# Patient Record
Sex: Male | Born: 1955 | Race: White | Hispanic: No | Marital: Married
Health system: Southern US, Community
[De-identification: ages and names within clinical notes are randomized; demographics above are authoritative.]

---

## 2006-06-02 ENCOUNTER — Emergency Department (HOSPITAL_COMMUNITY): Admission: EM | Admit: 2006-06-02 | Discharge: 2006-06-02 | Payer: Self-pay | Admitting: Emergency Medicine

## 2008-09-10 ENCOUNTER — Observation Stay (HOSPITAL_COMMUNITY): Admission: EM | Admit: 2008-09-10 | Discharge: 2008-09-11 | Payer: Self-pay | Admitting: Emergency Medicine

## 2008-09-10 ENCOUNTER — Ambulatory Visit: Payer: Self-pay | Admitting: Cardiology

## 2008-09-11 ENCOUNTER — Encounter (INDEPENDENT_AMBULATORY_CARE_PROVIDER_SITE_OTHER): Payer: Self-pay | Admitting: Emergency Medicine

## 2008-09-18 DIAGNOSIS — R55 Syncope and collapse: Secondary | ICD-10-CM | POA: Insufficient documentation

## 2008-09-18 DIAGNOSIS — R079 Chest pain, unspecified: Secondary | ICD-10-CM | POA: Insufficient documentation

## 2008-09-19 ENCOUNTER — Ambulatory Visit: Payer: Self-pay | Admitting: Cardiovascular Disease

## 2008-09-19 DIAGNOSIS — R0602 Shortness of breath: Secondary | ICD-10-CM | POA: Insufficient documentation

## 2008-12-21 IMAGING — CT CT CERVICAL SPINE W/O CM
4 of 5 series · 16 of 33 positions shown, 18 images · non-contrast
Comparison: None.
COMPARISON: None.

CLINICAL DATA: Syncope. Fall. Headache. Neck pain.

CRANIAL CT WITHOUT CONTRAST  06/02/2006:
TECHNIQUE: 5mm axial images were obtained from the skull base through the
vertex without intravenous contrast.
TECHNIQUE: Multidetector CT imaging of the cervical spine was performed without
contrast.  Sagittal and coronal plane reformatted images were reconstructed from
the axial CT data, and were also reviewed.

[Series 6: c_spine 2.0 b31s detail · axial · 0.24mm/px · z∈[-252,-156]mm · 4 of 80 slices shown, 5 images]
[im 16/80  soft-tissue]
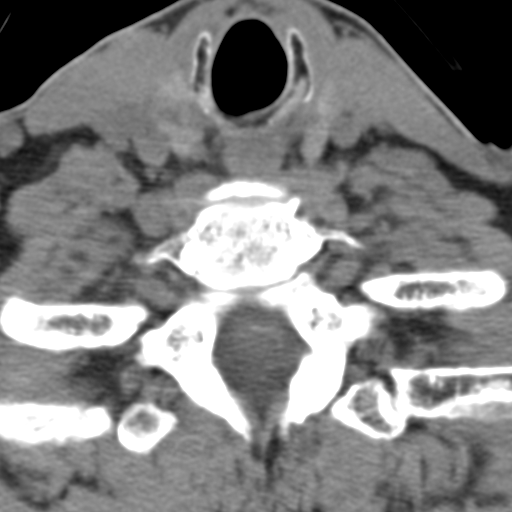
[im 16/80  bone]
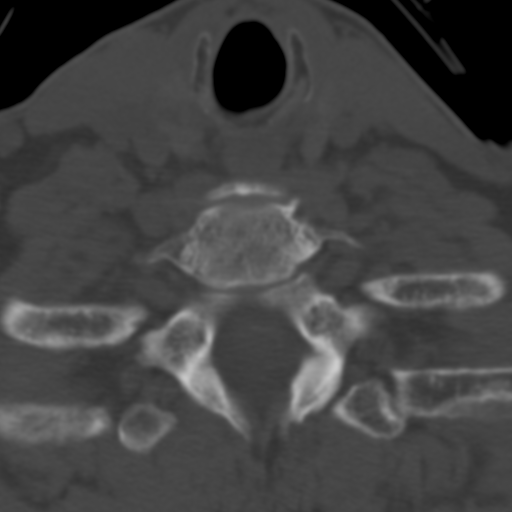
[im 32/80  bone]
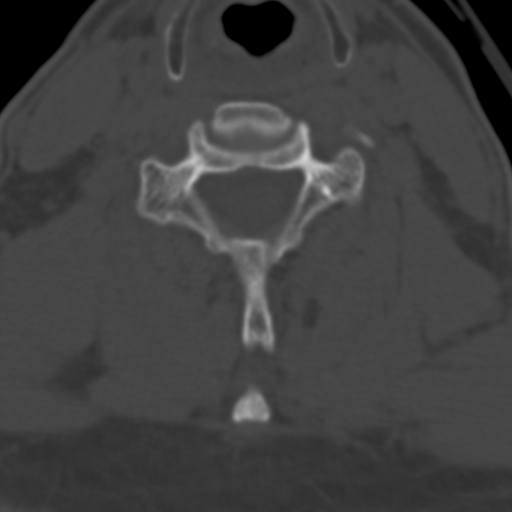
[im 48/80  bone]
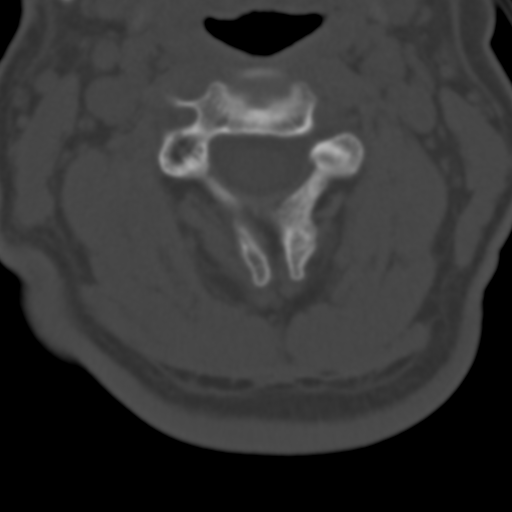
[im 64/80  bone]
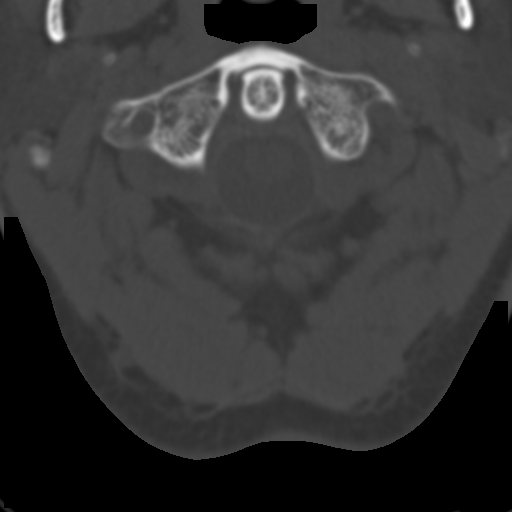

[Series 7: c_spine 2.0 spo · sagittal · 0.32mm/px · 5 of 41 slices shown, 6 images (1 of 3)]
[im 14/41  bone]
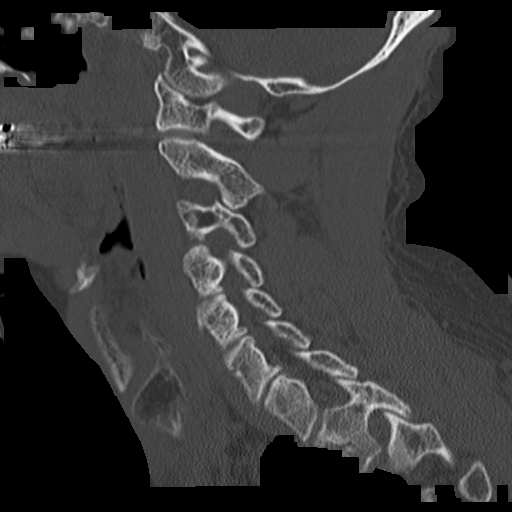
[im 17/41  bone]
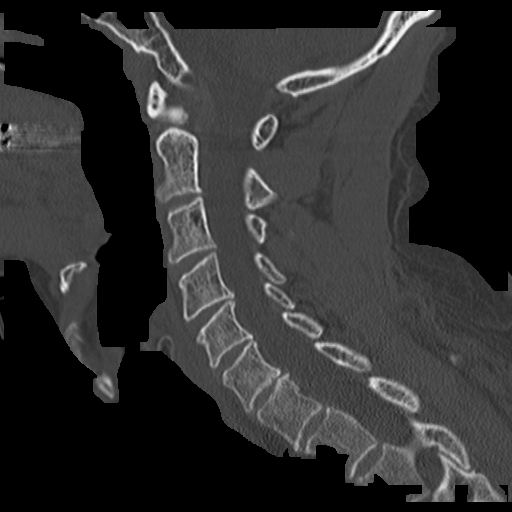
[im 21/41  soft-tissue]
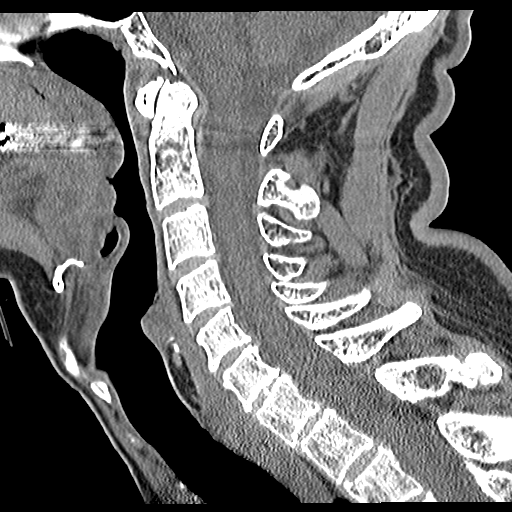
[im 21/41  bone]
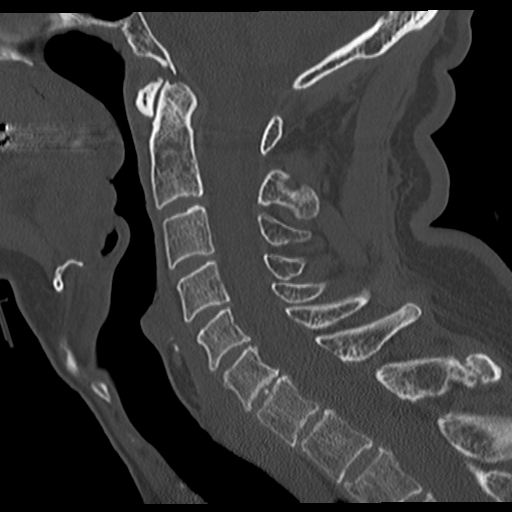
[im 24/41  bone]
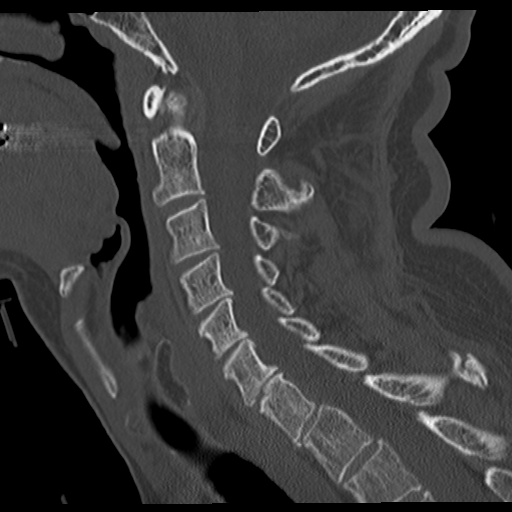
[im 27/41  bone]
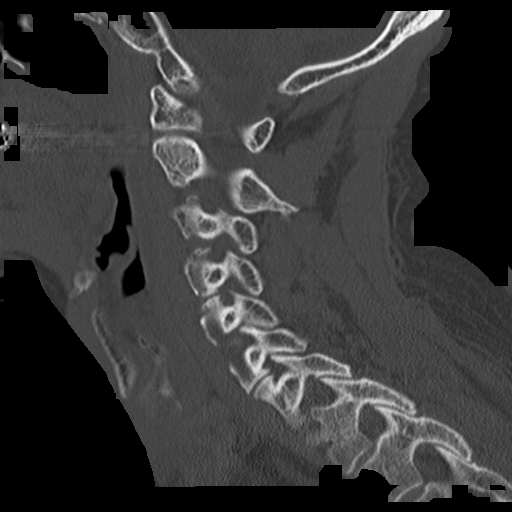

[Series 8: c_spine 2.0 spo · coronal · 0.39mm/px · 3 of 48 slices shown (2 of 3)]
[im 10/48  bone]
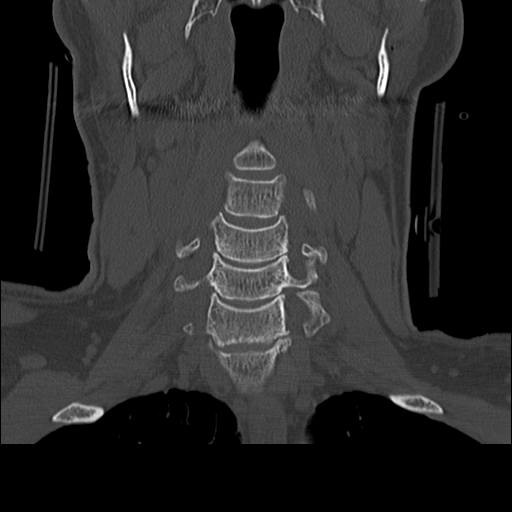
[im 19/48  bone]
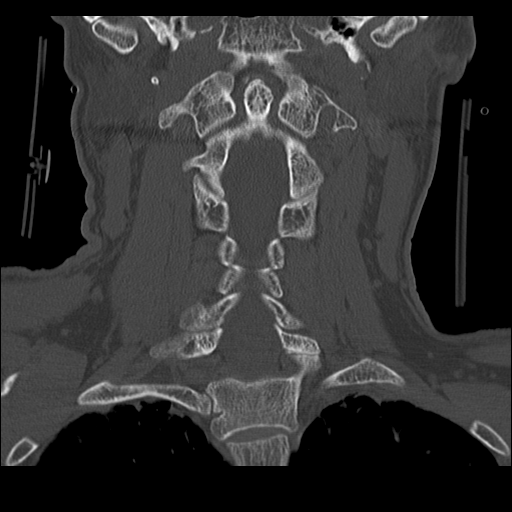
[im 29/48  bone]
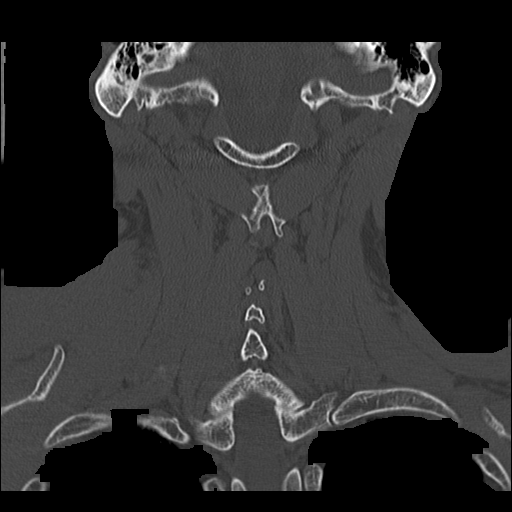

[Series 9: c_spine 2.0 spo · axial · 0.26mm/px · z∈[-279,-189]mm · 4 of 80 slices shown (3 of 3)]
[im 16/80  bone]
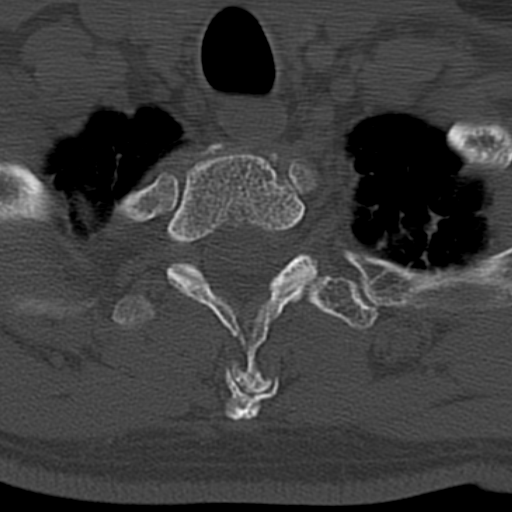
[im 32/80  bone]
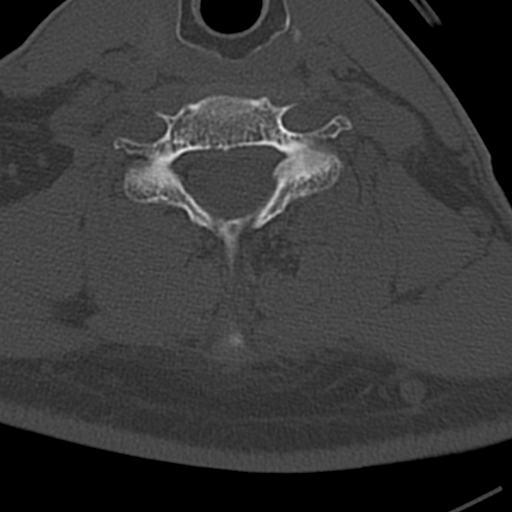
[im 48/80  bone]
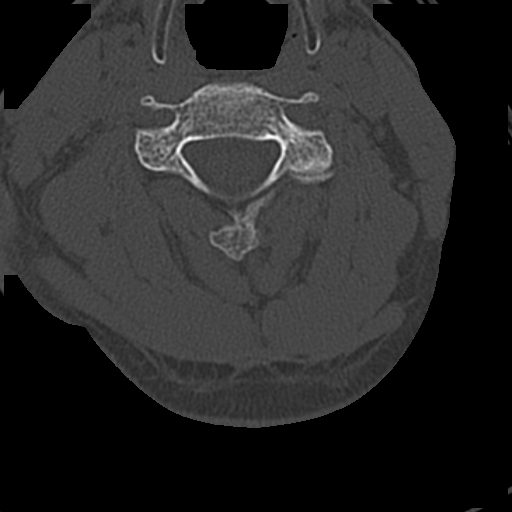
[im 64/80  bone]
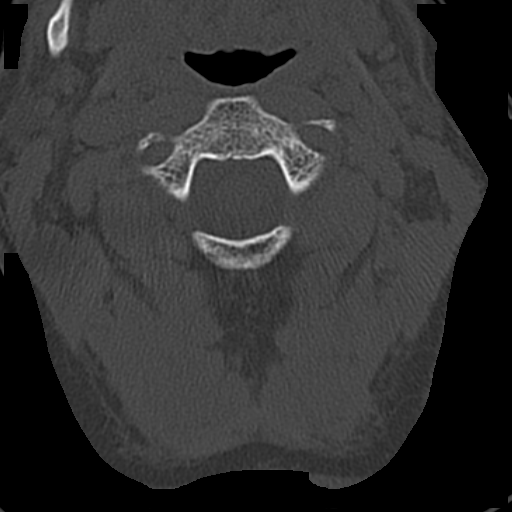

[16 of 33 positions shown; findings below may reference images not displayed]

FINDINGS: Ventricular system normal in size and appearance for age. No mass
lesion or midline shift. No acute hemorrhage or hematoma. No extra-axial fluid
collections. No evidence of acute stroke/infarction or other focal brain
parenchymal abnormalities at this time.

No skull fractures or other focal osseous abnormalities involving the skull.
Opacification of anterior ethmoid air cells bilaterally. Remaining visualized
paranasal sinuses and mastoid air cells well aerated.
IMPRESSION: 1. Normal intracranially.
2. Mild bilateral ethmoid sinusitis. 

CT CERVICAL SPINE WITHOUT CONTRAST 06/02/2006:
FINDINGS: Well corticated old fracture involving the spinous process of T1
versus ossification in the nuchal ligament at this level. No acute fractures
identified involving the cervical spine. Anatomic alignment on the sagittal
reconstructed images. Disc space narrowing endplate hypertrophic changes at
C5-C6 and C6-C7. No spinal stenosis. Multilevel foraminal stenoses due to a
combination of facet and uncinate hypertrophy as follows: Mild left C3-C4,
severe bilateral C4-C5, severe bilateral C5-C6, severe bilateral C6-C7. Coronal
reformatted images demonstrating normal craniocervical junction and normal C1-C2
articulation. Dens intact. Lateral masses intact. Note made of biapical pleural
and parenchymal scarring. Note also made of focal fatty marrow replacement in
the right pedicle and right vertebral body of C3.
IMPRESSION: 1. No acute skeletal abnormalities.
2. Old fracture with well corticated margins involving the spinous process of T1
versus ossification in the nuchal ligament.
3. Degenerative disc disease and spondylosis at C6-C7 and C5-C6. No spinal
stenosis. Multilevel foraminal stenoses as detailed above.
4. Focal fatty marrow replacement in the right pedicle and right side of the C3
vertebral body.

## 2010-04-11 LAB — COMPREHENSIVE METABOLIC PANEL
BUN: 12 mg/dL (ref 6–23)
CO2: 29 mEq/L (ref 19–32)
Chloride: 106 mEq/L (ref 96–112)
Creatinine, Ser: 1.18 mg/dL (ref 0.4–1.5)
GFR calc non Af Amer: >60 mL/min (ref >60)
Total Bilirubin: 0.9 mg/dL (ref 0.3–1.2)

## 2010-04-11 LAB — DIFFERENTIAL
Basophils Absolute: 0 10*3/uL (ref 0.0–0.1)
Eosinophils Relative: 1 % (ref 0–5)
Lymphocytes Relative: 18 % (ref 12–46)
Neutro Abs: 5.7 10*3/uL (ref 1.7–7.7)

## 2010-04-11 LAB — POCT I-STAT, CHEM 8
BUN: 13 mg/dL (ref 6–23)
Calcium, Ion: 1.17 mmol/L (ref 1.12–1.32)
Chloride: 104 mEq/L (ref 96–112)
Sodium: 140 mEq/L (ref 135–145)

## 2010-04-11 LAB — CBC
HCT: 44.8 % (ref 39.0–52.0)
MCV: 90.6 fL (ref 78.0–100.0)
RBC: 4.95 MIL/uL (ref 4.22–5.81)
WBC: 7.6 10*3/uL (ref 4.0–10.5)

## 2010-04-11 LAB — POCT CARDIAC MARKERS
CKMB, poc: <1 ng/mL — ABNORMAL LOW (ref 1.0–8.0)
CKMB, poc: <1 ng/mL — ABNORMAL LOW (ref 1.0–8.0)
Troponin i, poc: <0.05 ng/mL (ref 0.00–0.09)
Troponin i, poc: <0.05 ng/mL (ref 0.00–0.09)
Troponin i, poc: <0.05 ng/mL (ref 0.00–0.09)

## 2011-04-01 IMAGING — CR DG CHEST 1V PORT
1 series · 1 of 1 positions shown · non-contrast
Comparison: None available.

CLINICAL DATA: Chest pain shortness of breath.

PORTABLE CHEST - 1 VIEW

[view not recorded]
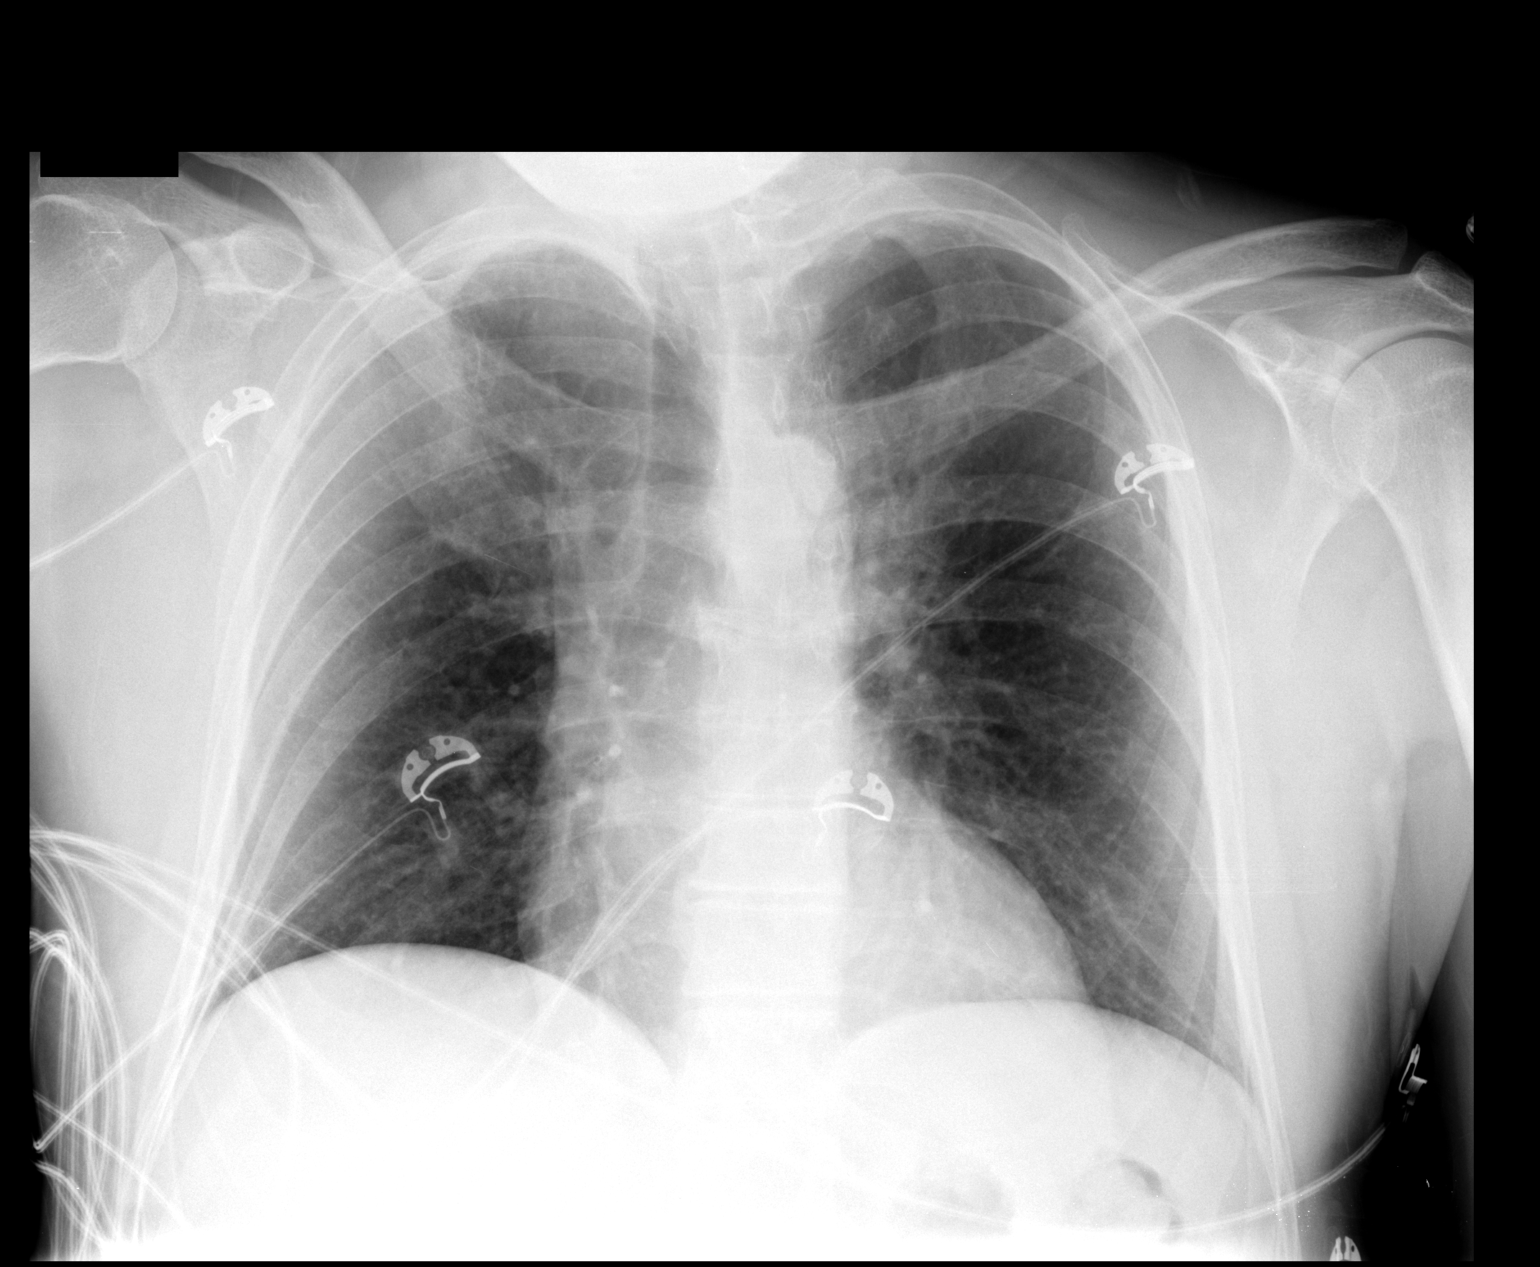

[1 of 1 positions shown; findings below may reference images not displayed]

FINDINGS: Lungs clear.  Heart size normal.  No pleural effusion or
focal bony abnormality.
IMPRESSION: No acute disease.

## 2016-01-17 DIAGNOSIS — H5203 Hypermetropia, bilateral: Secondary | ICD-10-CM | POA: Diagnosis not present

## 2016-02-25 ENCOUNTER — Ambulatory Visit (INDEPENDENT_AMBULATORY_CARE_PROVIDER_SITE_OTHER): Payer: Self-pay | Admitting: Nurse Practitioner

## 2016-02-25 DIAGNOSIS — H6123 Impacted cerumen, bilateral: Secondary | ICD-10-CM

## 2016-02-25 NOTE — Progress Notes (Signed)
Subjective:    Matthew Harvey is a 61 y.o. male whom I am asked to see for evaluation of diminished hearing in both ears for the past 3 days. There is not a prior history of cerumen impaction. The patient has not been using ear drops to loosen wax immediately prior to this visit. The patient denies ear pain.  The patient states his left ear started draining 3 days ago and woke up this morning and had diminished hearing out of both ears.   The patient's history has been marked as reviewed and updated as appropriate.  Review of Systems Constitutional: negative Eyes: negative Ears, nose, mouth, throat, and face: positive for diminished hearing bilaterally Respiratory: negative Cardiovascular: negative    Objective:    Auditory canal(s) of both ears are completely obstructed with cerumen.   Cerumen was removed using gentle irrigation. Tympanic membranes are intact following the procedure.  Auditory canals are normal.    Assessment:    Cerumen Impaction without otitis externa.    Plan:    1. Care instructions given. 2. Home treatment: none. 3. Follow-up as needed.   4.  Use Peroxide with flushing to prevent further cerumen impaction.

## 2016-02-25 NOTE — Patient Instructions (Addendum)
Earwax Buildup Your ears make a substance called earwax. It may also be called cerumen. Sometimes, too much earwax builds up in your ear canal. This can cause ear pain and make it harder for you to hear. CAUSES This condition is caused by too much earwax production or buildup. RISK FACTORS The following factors may make you more likely to develop this condition:  Cleaning your ears often with swabs.  Having narrow ear canals.  Having earwax that is overly thick or sticky.  Having eczema.  Being dehydrated. SYMPTOMS Symptoms of this condition include:  Reduced hearing.  Ear drainage.  Ear pain.  Ear itch.  A feeling of fullness in the ear or feeling that the ear is plugged.  Ringing in the ear.  Coughing. DIAGNOSIS Your health care provider can diagnose this condition based on your symptoms and medical history. Your health care provider will also do an ear exam to look inside your ear with a scope (otoscope). You may also have a hearing test. TREATMENT Treatment for this condition includes:  Over-the-counter or prescription ear drops to soften the earwax.  Earwax removal by a health care provider. This may be done:  By flushing the ear with body-temperature water.  With a medical instrument that has a loop at the end (earwax curette).  With a suction device.  Use Peroxide as needed with warm water to flush ears.  HOME CARE INSTRUCTIONS  Take over-the-counter and prescription medicines only as told by your health care provider.  Do not put any objects, including an ear swab, into your ear. You can clean the opening of your ear canal with a washcloth.  Drink enough water to keep your urine clear or pale yellow.  If you have frequent earwax buildup or you use hearing aids, consider seeing your health care provider every 6-12 months for routine preventive ear cleanings. Keep all follow-up visits as told by your health care provider. SEEK MEDICAL CARE IF:  You  have ear pain.  Your condition does not improve with treatment.  You have hearing loss.  You have blood, pus, or other fluid coming from your ear. This information is not intended to replace advice given to you by your health care provider. Make sure you discuss any questions you have with your health care provider. Document Released: 01/30/2004 Document Revised: 04/15/2015 Document Reviewed: 08/08/2014 Elsevier Interactive Patient Education  2017 [employer].

## 2016-04-24 DIAGNOSIS — L720 Epidermal cyst: Secondary | ICD-10-CM | POA: Diagnosis not present

## 2016-05-07 DIAGNOSIS — L720 Epidermal cyst: Secondary | ICD-10-CM | POA: Diagnosis not present

## 2016-05-07 DIAGNOSIS — L723 Sebaceous cyst: Secondary | ICD-10-CM | POA: Diagnosis not present

## 2016-06-09 DIAGNOSIS — L578 Other skin changes due to chronic exposure to nonionizing radiation: Secondary | ICD-10-CM | POA: Diagnosis not present

## 2016-06-09 DIAGNOSIS — L570 Actinic keratosis: Secondary | ICD-10-CM | POA: Diagnosis not present

## 2016-06-09 DIAGNOSIS — L821 Other seborrheic keratosis: Secondary | ICD-10-CM | POA: Diagnosis not present

## 2016-06-09 DIAGNOSIS — Z85828 Personal history of other malignant neoplasm of skin: Secondary | ICD-10-CM | POA: Diagnosis not present

## 2016-06-18 DIAGNOSIS — K635 Polyp of colon: Secondary | ICD-10-CM | POA: Diagnosis not present

## 2016-06-18 DIAGNOSIS — Z1211 Encounter for screening for malignant neoplasm of colon: Secondary | ICD-10-CM | POA: Diagnosis not present

## 2016-08-19 DIAGNOSIS — Z125 Encounter for screening for malignant neoplasm of prostate: Secondary | ICD-10-CM | POA: Diagnosis not present

## 2016-08-19 DIAGNOSIS — Z0000 Encounter for general adult medical examination without abnormal findings: Secondary | ICD-10-CM | POA: Diagnosis not present

## 2016-10-21 DIAGNOSIS — Z23 Encounter for immunization: Secondary | ICD-10-CM | POA: Diagnosis not present

## 2017-02-17 DIAGNOSIS — M25561 Pain in right knee: Secondary | ICD-10-CM | POA: Diagnosis not present

## 2017-03-17 DIAGNOSIS — M25561 Pain in right knee: Secondary | ICD-10-CM | POA: Diagnosis not present

## 2017-06-21 DIAGNOSIS — L570 Actinic keratosis: Secondary | ICD-10-CM | POA: Diagnosis not present

## 2017-06-21 DIAGNOSIS — D225 Melanocytic nevi of trunk: Secondary | ICD-10-CM | POA: Diagnosis not present

## 2017-06-21 DIAGNOSIS — Z85828 Personal history of other malignant neoplasm of skin: Secondary | ICD-10-CM | POA: Diagnosis not present

## 2017-06-21 DIAGNOSIS — L821 Other seborrheic keratosis: Secondary | ICD-10-CM | POA: Diagnosis not present

## 2017-09-02 DIAGNOSIS — J309 Allergic rhinitis, unspecified: Secondary | ICD-10-CM | POA: Diagnosis not present

## 2017-09-02 DIAGNOSIS — Z13220 Encounter for screening for lipoid disorders: Secondary | ICD-10-CM | POA: Diagnosis not present

## 2017-09-02 DIAGNOSIS — Z0000 Encounter for general adult medical examination without abnormal findings: Secondary | ICD-10-CM | POA: Diagnosis not present

## 2017-10-05 DIAGNOSIS — L570 Actinic keratosis: Secondary | ICD-10-CM | POA: Diagnosis not present

## 2018-04-06 DIAGNOSIS — M7541 Impingement syndrome of right shoulder: Secondary | ICD-10-CM | POA: Diagnosis not present

## 2018-04-06 DIAGNOSIS — M542 Cervicalgia: Secondary | ICD-10-CM | POA: Diagnosis not present

## 2018-05-04 DIAGNOSIS — M25511 Pain in right shoulder: Secondary | ICD-10-CM | POA: Diagnosis not present

## 2018-05-09 DIAGNOSIS — M25511 Pain in right shoulder: Secondary | ICD-10-CM | POA: Diagnosis not present

## 2018-05-13 DIAGNOSIS — M25511 Pain in right shoulder: Secondary | ICD-10-CM | POA: Diagnosis not present

## 2021-10-29 ENCOUNTER — Ambulatory Visit
Admission: RE | Admit: 2021-10-29 | Discharge: 2021-10-29 | Disposition: A | Discharge status: Home / Self Care | Payer: 59 | Source: Ambulatory Visit | Attending: Family Medicine | Admitting: Family Medicine

## 2021-10-29 ENCOUNTER — Other Ambulatory Visit: Payer: Self-pay | Admitting: Family Medicine

## 2021-10-29 DIAGNOSIS — R52 Pain, unspecified: Secondary | ICD-10-CM

## 2022-10-14 DIAGNOSIS — S83282D Other tear of lateral meniscus, current injury, left knee, subsequent encounter: Secondary | ICD-10-CM | POA: Diagnosis not present

## 2022-10-22 ENCOUNTER — Other Ambulatory Visit: Payer: Self-pay | Admitting: Family Medicine

## 2022-10-22 DIAGNOSIS — Z1159 Encounter for screening for other viral diseases: Secondary | ICD-10-CM | POA: Diagnosis not present

## 2022-10-22 DIAGNOSIS — M179 Osteoarthritis of knee, unspecified: Secondary | ICD-10-CM | POA: Diagnosis not present

## 2022-10-22 DIAGNOSIS — R350 Frequency of micturition: Secondary | ICD-10-CM | POA: Diagnosis not present

## 2022-10-22 DIAGNOSIS — Z13220 Encounter for screening for lipoid disorders: Secondary | ICD-10-CM | POA: Diagnosis not present

## 2022-10-22 DIAGNOSIS — J309 Allergic rhinitis, unspecified: Secondary | ICD-10-CM | POA: Diagnosis not present

## 2022-10-22 DIAGNOSIS — Z131 Encounter for screening for diabetes mellitus: Secondary | ICD-10-CM | POA: Diagnosis not present

## 2022-10-22 DIAGNOSIS — Z136 Encounter for screening for cardiovascular disorders: Secondary | ICD-10-CM

## 2022-10-22 DIAGNOSIS — Z79899 Other long term (current) drug therapy: Secondary | ICD-10-CM | POA: Diagnosis not present

## 2022-10-22 DIAGNOSIS — Z125 Encounter for screening for malignant neoplasm of prostate: Secondary | ICD-10-CM | POA: Diagnosis not present

## 2022-10-22 DIAGNOSIS — Z9181 History of falling: Secondary | ICD-10-CM | POA: Diagnosis not present

## 2022-10-22 DIAGNOSIS — Z87891 Personal history of nicotine dependence: Secondary | ICD-10-CM

## 2022-10-22 DIAGNOSIS — Z0000 Encounter for general adult medical examination without abnormal findings: Secondary | ICD-10-CM | POA: Diagnosis not present

## 2022-10-22 DIAGNOSIS — Z720 Tobacco use: Secondary | ICD-10-CM | POA: Diagnosis not present

## 2022-10-26 ENCOUNTER — Ambulatory Visit: Payer: 59

## 2022-11-16 DIAGNOSIS — Z85828 Personal history of other malignant neoplasm of skin: Secondary | ICD-10-CM | POA: Diagnosis not present

## 2022-11-16 DIAGNOSIS — L570 Actinic keratosis: Secondary | ICD-10-CM | POA: Diagnosis not present

## 2022-11-16 DIAGNOSIS — L821 Other seborrheic keratosis: Secondary | ICD-10-CM | POA: Diagnosis not present

## 2022-11-16 DIAGNOSIS — L812 Freckles: Secondary | ICD-10-CM | POA: Diagnosis not present

## 2022-11-16 DIAGNOSIS — D225 Melanocytic nevi of trunk: Secondary | ICD-10-CM | POA: Diagnosis not present

## 2022-11-16 DIAGNOSIS — D1801 Hemangioma of skin and subcutaneous tissue: Secondary | ICD-10-CM | POA: Diagnosis not present

## 2022-11-16 DIAGNOSIS — D692 Other nonthrombocytopenic purpura: Secondary | ICD-10-CM | POA: Diagnosis not present

## 2022-11-18 DIAGNOSIS — X58XXXA Exposure to other specified factors, initial encounter: Secondary | ICD-10-CM | POA: Diagnosis not present

## 2022-11-18 DIAGNOSIS — S83262A Peripheral tear of lateral meniscus, current injury, left knee, initial encounter: Secondary | ICD-10-CM | POA: Diagnosis not present

## 2022-11-18 DIAGNOSIS — M65962 Unspecified synovitis and tenosynovitis, left lower leg: Secondary | ICD-10-CM | POA: Diagnosis not present

## 2022-11-18 DIAGNOSIS — M94262 Chondromalacia, left knee: Secondary | ICD-10-CM | POA: Diagnosis not present

## 2022-11-18 DIAGNOSIS — Y999 Unspecified external cause status: Secondary | ICD-10-CM | POA: Diagnosis not present

## 2022-11-18 DIAGNOSIS — G8918 Other acute postprocedural pain: Secondary | ICD-10-CM | POA: Diagnosis not present

## 2022-11-18 DIAGNOSIS — M2242 Chondromalacia patellae, left knee: Secondary | ICD-10-CM | POA: Diagnosis not present

## 2022-11-18 DIAGNOSIS — S83282A Other tear of lateral meniscus, current injury, left knee, initial encounter: Secondary | ICD-10-CM | POA: Diagnosis not present

## 2022-11-18 DIAGNOSIS — M794 Hypertrophy of (infrapatellar) fat pad: Secondary | ICD-10-CM | POA: Diagnosis not present

## 2022-11-25 DIAGNOSIS — M25662 Stiffness of left knee, not elsewhere classified: Secondary | ICD-10-CM | POA: Diagnosis not present

## 2022-11-25 DIAGNOSIS — M25562 Pain in left knee: Secondary | ICD-10-CM | POA: Diagnosis not present

## 2022-12-01 DIAGNOSIS — M25562 Pain in left knee: Secondary | ICD-10-CM | POA: Diagnosis not present

## 2022-12-01 DIAGNOSIS — M25662 Stiffness of left knee, not elsewhere classified: Secondary | ICD-10-CM | POA: Diagnosis not present

## 2022-12-07 ENCOUNTER — Ambulatory Visit
Admission: RE | Admit: 2022-12-07 | Discharge: 2022-12-07 | Disposition: A | Discharge status: Home / Self Care | Payer: 59 | Source: Ambulatory Visit | Attending: Family Medicine | Admitting: Family Medicine

## 2022-12-07 DIAGNOSIS — Z87891 Personal history of nicotine dependence: Secondary | ICD-10-CM

## 2022-12-07 DIAGNOSIS — Z136 Encounter for screening for cardiovascular disorders: Secondary | ICD-10-CM

## 2022-12-08 DIAGNOSIS — M25662 Stiffness of left knee, not elsewhere classified: Secondary | ICD-10-CM | POA: Diagnosis not present

## 2022-12-08 DIAGNOSIS — M25562 Pain in left knee: Secondary | ICD-10-CM | POA: Diagnosis not present

## 2022-12-15 DIAGNOSIS — M25562 Pain in left knee: Secondary | ICD-10-CM | POA: Diagnosis not present

## 2022-12-15 DIAGNOSIS — M25662 Stiffness of left knee, not elsewhere classified: Secondary | ICD-10-CM | POA: Diagnosis not present

## 2022-12-17 DIAGNOSIS — M25562 Pain in left knee: Secondary | ICD-10-CM | POA: Diagnosis not present

## 2022-12-17 DIAGNOSIS — M25662 Stiffness of left knee, not elsewhere classified: Secondary | ICD-10-CM | POA: Diagnosis not present

## 2023-04-05 DIAGNOSIS — D485 Neoplasm of uncertain behavior of skin: Secondary | ICD-10-CM | POA: Diagnosis not present

## 2023-04-05 DIAGNOSIS — L738 Other specified follicular disorders: Secondary | ICD-10-CM | POA: Diagnosis not present

## 2023-04-05 DIAGNOSIS — Z85828 Personal history of other malignant neoplasm of skin: Secondary | ICD-10-CM | POA: Diagnosis not present

## 2023-04-05 DIAGNOSIS — L570 Actinic keratosis: Secondary | ICD-10-CM | POA: Diagnosis not present

## 2023-04-05 DIAGNOSIS — C4442 Squamous cell carcinoma of skin of scalp and neck: Secondary | ICD-10-CM | POA: Diagnosis not present

## 2023-04-13 DIAGNOSIS — H52223 Regular astigmatism, bilateral: Secondary | ICD-10-CM | POA: Diagnosis not present

## 2023-04-13 DIAGNOSIS — H2513 Age-related nuclear cataract, bilateral: Secondary | ICD-10-CM | POA: Diagnosis not present

## 2023-04-13 DIAGNOSIS — E783 Hyperchylomicronemia: Secondary | ICD-10-CM | POA: Diagnosis not present

## 2023-04-13 DIAGNOSIS — H40013 Open angle with borderline findings, low risk, bilateral: Secondary | ICD-10-CM | POA: Diagnosis not present

## 2023-04-13 DIAGNOSIS — H524 Presbyopia: Secondary | ICD-10-CM | POA: Diagnosis not present

## 2023-06-26 DIAGNOSIS — H6123 Impacted cerumen, bilateral: Secondary | ICD-10-CM | POA: Diagnosis not present

## 2023-08-19 DIAGNOSIS — Z85828 Personal history of other malignant neoplasm of skin: Secondary | ICD-10-CM | POA: Diagnosis not present

## 2023-08-19 DIAGNOSIS — L821 Other seborrheic keratosis: Secondary | ICD-10-CM | POA: Diagnosis not present

## 2023-08-19 DIAGNOSIS — L570 Actinic keratosis: Secondary | ICD-10-CM | POA: Diagnosis not present

## 2023-09-18 DIAGNOSIS — L0291 Cutaneous abscess, unspecified: Secondary | ICD-10-CM | POA: Diagnosis not present

## 2023-10-17 DIAGNOSIS — L729 Follicular cyst of the skin and subcutaneous tissue, unspecified: Secondary | ICD-10-CM | POA: Diagnosis not present

## 2023-11-02 DIAGNOSIS — Z131 Encounter for screening for diabetes mellitus: Secondary | ICD-10-CM | POA: Diagnosis not present

## 2023-11-02 DIAGNOSIS — Z0000 Encounter for general adult medical examination without abnormal findings: Secondary | ICD-10-CM | POA: Diagnosis not present

## 2023-11-02 DIAGNOSIS — L739 Follicular disorder, unspecified: Secondary | ICD-10-CM | POA: Diagnosis not present

## 2023-11-02 DIAGNOSIS — Z79899 Other long term (current) drug therapy: Secondary | ICD-10-CM | POA: Diagnosis not present

## 2023-11-02 DIAGNOSIS — Z720 Tobacco use: Secondary | ICD-10-CM | POA: Diagnosis not present

## 2023-11-02 DIAGNOSIS — Z1331 Encounter for screening for depression: Secondary | ICD-10-CM | POA: Diagnosis not present

## 2023-11-02 DIAGNOSIS — J309 Allergic rhinitis, unspecified: Secondary | ICD-10-CM | POA: Diagnosis not present

## 2023-11-02 DIAGNOSIS — M179 Osteoarthritis of knee, unspecified: Secondary | ICD-10-CM | POA: Diagnosis not present

## 2023-11-02 DIAGNOSIS — Z125 Encounter for screening for malignant neoplasm of prostate: Secondary | ICD-10-CM | POA: Diagnosis not present

## 2023-11-02 DIAGNOSIS — K219 Gastro-esophageal reflux disease without esophagitis: Secondary | ICD-10-CM | POA: Diagnosis not present

## 2023-11-02 DIAGNOSIS — E7800 Pure hypercholesterolemia, unspecified: Secondary | ICD-10-CM | POA: Diagnosis not present

## 2023-11-02 DIAGNOSIS — L600 Ingrowing nail: Secondary | ICD-10-CM | POA: Diagnosis not present

## 2023-11-29 ENCOUNTER — Ambulatory Visit: Admitting: Podiatry

## 2023-11-29 VITALS — Ht 69.0 in | Wt 183.0 lb

## 2023-11-29 DIAGNOSIS — L600 Ingrowing nail: Secondary | ICD-10-CM

## 2023-11-29 NOTE — Patient Instructions (Signed)

## 2023-11-30 NOTE — Progress Notes (Signed)
   Chief Complaint  Patient presents with   Nail Problem    Pt is here due to bilateral great toenails, states he has dealt with ingrown's all his life, wants to have both toenail permanently  removed.    Subjective: Patient presents today for evaluation of pain to the medial and lateral border of the bilateral great toes. Patient is concerned for possible ingrown nail.  It is very sensitive to touch.  Patient presents today for further treatment and evaluation.  No past medical history on file.  No Known Allergies  Objective:  General: Well developed, nourished, in no acute distress, alert and oriented x3   Dermatology: Skin is warm, dry and supple bilateral.  Medial and lateral border bilateral great toes is tender with evidence of an ingrowing nail. Pain on palpation noted to the border of the nail fold. The remaining nails appear unremarkable at this time.   Vascular: DP and PT pulses palpable.  No clinical evidence of vascular compromise  Neruologic: Grossly intact via light touch bilateral.  Musculoskeletal: No pedal deformity noted  Assesement: #1 Paronychia with ingrowing nail medial and lateral border bilateral great toes  Plan of Care:  -Patient evaluated.  -Discussed treatment alternatives and plan of care. Explained nail avulsion procedure and post procedure course to patient. -Patient opted for permanent total nail avulsion of the ingrown portion of the nail.  -Prior to procedure, local anesthesia infiltration utilized using 3 ml of a 50:50 mixture of 2% plain lidocaine and 0.5% plain marcaine in a normal hallux block fashion and a betadine prep performed.  - Total permanent nail avulsion with chemical matrixectomy performed using 3x30sec applications of phenol followed by alcohol flush.  -Light dressing applied.  Post care instructions provided -Return to clinic 4 weeks  *Joen Gentry referral  Thresa EMERSON Sar, DPM [facility]  Dr. Thresa EMERSON Sar, DPM    [address] [place], [place] [address]                Office [phone]  Fax [phone]

## 2023-12-08 ENCOUNTER — Telehealth: Payer: Self-pay | Admitting: Lab

## 2023-12-08 ENCOUNTER — Other Ambulatory Visit: Payer: Self-pay | Admitting: Podiatry

## 2023-12-08 MED ORDER — DOXYCYCLINE HYCLATE 100 MG PO TABS: 100.0000 mg | ORAL_TABLET | Freq: Two times a day (BID) | ORAL | 0 refills | Status: AC

## 2023-12-08 NOTE — Progress Notes (Signed)
 Patient called the office today for concern of possible infection to the ingrowing portion of the toenails.  He has noticed some redness with tenderness to the area.  Prescription for doxycycline 100 mg twice daily sent to the pharmacy x 10 days.  Continue soaking and triple antibiotic and a Band-Aid over the area.  He has a follow-up in about 2 weeks  Thresa EMERSON Sar, DPM [facility]  Dr. Thresa EMERSON Sar, DPM    [address] [place], [place] [address]                Office [phone]  Fax [phone]

## 2023-12-08 NOTE — Telephone Encounter (Signed)
 Patient showing signs of infection after procedure 11/29/23 red,swollen and ache please advise.

## 2023-12-13 DIAGNOSIS — Z85828 Personal history of other malignant neoplasm of skin: Secondary | ICD-10-CM | POA: Diagnosis not present

## 2023-12-13 DIAGNOSIS — L738 Other specified follicular disorders: Secondary | ICD-10-CM | POA: Diagnosis not present

## 2023-12-13 DIAGNOSIS — L570 Actinic keratosis: Secondary | ICD-10-CM | POA: Diagnosis not present

## 2023-12-27 ENCOUNTER — Ambulatory Visit: Admitting: Podiatry

## 2023-12-27 VITALS — Ht 69.0 in | Wt 183.0 lb

## 2023-12-27 DIAGNOSIS — L600 Ingrowing nail: Secondary | ICD-10-CM | POA: Diagnosis not present

## 2023-12-27 NOTE — Progress Notes (Signed)
" ° °  Chief Complaint  Patient presents with   Ingrown Toenail    Pt is here to f/u on bilateral great toenails after having ingrown removed, states he has had some drainage from both toes, little to no pain, has some redness believes its due to band aids.    Subjective: 68 y.o. male presents today status post permanent nail avulsion procedure of the medial and lateral border of the bilateral great toes that was performed on 11/29/2023.   No past medical history on file.  Objective: Neurovascular status intact.  Skin is warm, dry and supple. Nail and respective nail fold appears to be healing appropriately.   Assessment: #1 s/p partial permanent nail matrixectomy MED + LAT B/L great toes   Plan of care: -Patient was evaluated  -Light debridement of the periungual debris was performed to the border of the respective toe and nail plate using a tissue nipper. -Patient is to return to clinic on a PRN basis.  *Joen Gentry referral   Thresa EMERSON Sar, DPM [facility]  Dr. Thresa EMERSON Sar, DPM    [address] [place], [place] [address]                Office [phone]  Fax [phone]     "

## 2024-01-26 ENCOUNTER — Ambulatory Visit: Admitting: Podiatry

## 2024-01-26 VITALS — Ht 69.0 in | Wt 183.0 lb

## 2024-01-26 DIAGNOSIS — L600 Ingrowing nail: Secondary | ICD-10-CM

## 2024-02-01 NOTE — Progress Notes (Signed)
" ° °  Chief Complaint  Patient presents with   Nail Problem    Pt is here due to right great toenail, had it removed and states the nail is growing back    HPI: 69 y.o. malepresenting for follow-up evaluation after avulsion of the bilateral toenail plates.  He believes that it is growing back and possibly there is new nail growth.  No pain.  No past medical history on file.  Allergies[1]   Physical Exam: General: The patient is alert and oriented x3 in no acute distress.  Dermatology: Skin is warm, dry and supple bilateral lower extremities.  Absence of the right hallux nail plate noted.  The underlying nailbed is hard almost acting as a pseudo nail.  There is no pain or tenderness with palpation.  Overall it appears to be a well-healed nailbed  Vascular: Palpable pedal pulses bilaterally. Capillary refill within normal limits.  No appreciable edema.  No erythema.  Neurological: Grossly intact via light touch  Musculoskeletal Exam: No pedal deformities noted   Assessment/Plan of Care: S/p total nail avulsion right hallux nail plate  - Patient evaluated. -Explained to the patient that the toe appears to be healing appropriately -Continue wearing good supportive tennis shoes and sneakers -Return to clinic PRN      Thresa EMERSON Sar, DPM [facility]  Dr. Thresa EMERSON Sar, DPM    [address] [place], [place] [address]                Office [phone]  Fax [phone]        [1] No Known Allergies  "
